# Patient Record
Sex: Male | Born: 2000 | Hispanic: No | Marital: Single | Smoking: Never smoker
Health system: Southern US, Community
[De-identification: ages and names within clinical notes are randomized; demographics above are authoritative.]

## PROBLEM LIST (undated history)

## (undated) DIAGNOSIS — E079 Disorder of thyroid, unspecified: Secondary | ICD-10-CM

---
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
## 2021-12-05 ENCOUNTER — Other Ambulatory Visit: Payer: Self-pay

## 2021-12-05 ENCOUNTER — Emergency Department (HOSPITAL_COMMUNITY)
Admission: EM | Admit: 2021-12-05 | Discharge: 2021-12-06 | Disposition: A | Discharge status: Home / Self Care | Payer: Medicaid Other | Attending: Emergency Medicine | Admitting: Emergency Medicine

## 2021-12-05 ENCOUNTER — Encounter (HOSPITAL_COMMUNITY): Payer: Self-pay

## 2021-12-05 DIAGNOSIS — W5911XA Bitten by nonvenomous snake, initial encounter: Secondary | ICD-10-CM

## 2021-12-05 DIAGNOSIS — T63001A Toxic effect of unspecified snake venom, accidental (unintentional), initial encounter: Secondary | ICD-10-CM | POA: Insufficient documentation

## 2021-12-05 DIAGNOSIS — Z23 Encounter for immunization: Secondary | ICD-10-CM | POA: Insufficient documentation

## 2021-12-05 LAB — CBC WITH DIFFERENTIAL/PLATELET
Abs Immature Granulocytes: 0.03 10*3/uL (ref 0.00–0.07)
Abs Immature Granulocytes: 0.02 10*3/uL (ref 0.00–0.07)
Basophils Absolute: 0 10*3/uL (ref 0.0–0.1)
Basophils Absolute: 0 10*3/uL (ref 0.0–0.1)
Basophils Relative: 0 %
Basophils Relative: 0 %
Eosinophils Absolute: 0 10*3/uL (ref 0.0–0.5)
Eosinophils Absolute: 0 10*3/uL (ref 0.0–0.5)
Eosinophils Relative: 0 %
Eosinophils Relative: 0 %
HCT: 45.6 % (ref 39.0–52.0)
HCT: 45.5 % (ref 39.0–52.0)
Hemoglobin: 16 g/dL (ref 13.0–17.0)
Hemoglobin: 16.2 g/dL (ref 13.0–17.0)
Immature Granulocytes: 0 %
Immature Granulocytes: 0 %
Lymphocytes Relative: 21 %
Lymphocytes Relative: 16 %
Lymphs Abs: 1.9 10*3/uL (ref 0.7–4.0)
Lymphs Abs: 1.4 10*3/uL (ref 0.7–4.0)
MCH: 32.3 pg (ref 26.0–34.0)
MCH: 32.6 pg (ref 26.0–34.0)
MCHC: 35.1 g/dL (ref 30.0–36.0)
MCHC: 35.6 g/dL (ref 30.0–36.0)
MCV: 92.1 fL (ref 80.0–100.0)
MCV: 91.5 fL (ref 80.0–100.0)
Monocytes Absolute: 0.6 10*3/uL (ref 0.1–1.0)
Monocytes Absolute: 0.5 10*3/uL (ref 0.1–1.0)
Monocytes Relative: 6 %
Monocytes Relative: 6 %
Neutro Abs: 6.6 10*3/uL (ref 1.7–7.7)
Neutro Abs: 6.8 10*3/uL (ref 1.7–7.7)
Neutrophils Relative %: 73 %
Neutrophils Relative %: 78 %
Platelets: 298 10*3/uL (ref 150–400)
Platelets: 296 10*3/uL (ref 150–400)
RBC: 4.95 MIL/uL (ref 4.22–5.81)
RBC: 4.97 MIL/uL (ref 4.22–5.81)
RDW: 12.5 % (ref 11.5–15.5)
RDW: 12.5 % (ref 11.5–15.5)
WBC: 9.1 10*3/uL (ref 4.0–10.5)
WBC: 8.9 10*3/uL (ref 4.0–10.5)
nRBC: 0 % (ref 0.0–0.2)
nRBC: 0 % (ref 0.0–0.2)

## 2021-12-05 LAB — PROTIME-INR
INR: 1.1 (ref 0.8–1.2)
INR: 1 (ref 0.8–1.2)
Prothrombin Time: 13.7 seconds (ref 11.4–15.2)
Prothrombin Time: 13.3 seconds (ref 11.4–15.2)

## 2021-12-05 LAB — BASIC METABOLIC PANEL
Anion gap: 9 (ref 5–15)
BUN: 10 mg/dL (ref 6–20)
CO2: 26 mmol/L (ref 22–32)
Calcium: 9.4 mg/dL (ref 8.9–10.3)
Chloride: 105 mmol/L (ref 98–111)
Creatinine, Ser: 1.17 mg/dL (ref 0.61–1.24)
GFR, Estimated: >60 mL/min (ref >60)
Glucose, Bld: 97 mg/dL (ref 70–99)
Potassium: 3.9 mmol/L (ref 3.5–5.1)
Sodium: 140 mmol/L (ref 135–145)

## 2021-12-05 LAB — FIBRINOGEN
Fibrinogen: 269 mg/dL (ref 210–475)
Fibrinogen: 280 mg/dL (ref 210–475)

## 2021-12-05 MED ORDER — TETANUS-DIPHTH-ACELL PERTUSSIS 5-2.5-18.5 LF-MCG/0.5 IM SUSY
0.5000 mL | PREFILLED_SYRINGE | Freq: Once | INTRAMUSCULAR | Status: AC
  Administered 2021-12-05: 0.5 mL via INTRAMUSCULAR
  Filled 2021-12-05: qty 0.5

## 2021-12-05 NOTE — ED Triage Notes (Signed)
Pt reports snake bite . Pt reports he thinks it was a copperhead. Pt denies any pain just localized pain

## 2021-12-05 NOTE — ED Provider Notes (Incomplete)
  [facility] EMERGENCY DEPARTMENT Provider Note   CSN: [id number] Arrival date & time: 12/05/21  2140     History {Add pertinent medical, surgical, social history, OB history to HPI:1} Chief Complaint  Patient presents with  . Snake Bite    Dylan Alvarez is a 21 y.o. male.  The history is provided by the patient.  Dylan Alvarez is a 21 y.o. male who presents to the Emergency Department complaining of snakebite.  He presents to the emergency department for evaluation of snakebite that happened just a few minutes prior to ED arrival.  He states that he was walking and a copperhead bit him in the right leg.  He had some mild tingling at the site, overall no symptoms currently.  He has no known medical problems and takes no medications    Home Medications Prior to Admission medications   Medication Sig Start Date End Date Taking? Authorizing Provider  albuterol (PROVENTIL HFA) 108 (90 Base) MCG/ACT inhaler INHALE 2 PUFFS INTO THE LUNGS EVERY 6 (SIX) HOURS AS NEEDED FOR WHEEZING. 04/28/16   Rumley, [place], DO      Allergies    Patient has no known allergies.    Review of Systems   Review of Systems  Physical Exam Updated Vital Signs BP 132/77 (BP Location: Right Arm)   Pulse 82   Temp (!) 97.5 F (36.4 C) (Oral)   Resp 14   SpO2 97%  Physical Exam  ED Results / Procedures / Treatments   Labs (all labs ordered are listed, but only abnormal results are displayed) Labs Reviewed  BASIC METABOLIC PANEL  CBC WITH DIFFERENTIAL/PLATELET  CBC WITH DIFFERENTIAL/PLATELET  PROTIME-INR  PROTIME-INR  FIBRINOGEN  FIBRINOGEN  CBC WITH DIFFERENTIAL/PLATELET  PROTIME-INR  FIBRINOGEN    EKG None  Radiology No results found.  Procedures Procedures  {Document cardiac monitor, telemetry assessment procedure when appropriate:1}  Medications Ordered in ED Medications  Tdap (BOOSTRIX) injection 0.5 mL (0.5 mLs Intramuscular Given 12/05/21 2212)    ED  Course/ Medical Decision Making/ A&P                           Medical Decision Making  ***  {Document critical care time when appropriate:1} {Document review of labs and clinical decision tools ie heart score, Chads2Vasc2 etc:1}  {Document your independent review of radiology images, and any outside records:1} {Document your discussion with family members, caretakers, and with consultants:1} {Document social determinants of health affecting pt's care:1} {Document your decision making why or why not admission, treatments were needed:1} Final Clinical Impression(s) / ED Diagnoses Final diagnoses:  None    Rx / DC Orders ED Discharge Orders     None

## 2021-12-05 NOTE — ED Provider Triage Note (Signed)
Emergency Medicine Provider Triage Evaluation Note  Dylan Alvarez , a 21 y.o. male  was evaluated in triage.  Pt complains of snake bite. Pt report he was walking outside talking on the phone and accidentally stepped on a 2-43ft long snake which bit him in his R ankle.  Thought it may be a copper head.  Report tinging sensation at the bite site without significant pain.  No fever, trouble breathing, cp, sob, abd cramping  Review of Systems  Positive: As above Negative: As above  Physical Exam  BP (!) 150/101 (BP Location: Right Arm)   Pulse (!) 108   Temp (!) 97.5 F (36.4 C) (Oral)   Resp (!) 24   SpO2 99%  Gen:   Awake, no distress   Resp:  Normal effort  MSK:   Moves extremities without difficulty  Other:  R ankle: no obvious bite mark appreciated.  Leg compartment soft, brisk cap refill, intact dorsalis pedis pulse  Medical Decision Making  Medically screening exam initiated at 9:55 PM.  Appropriate orders placed.  Dylan Alvarez was informed that the remainder of the evaluation will be completed by another provider, this initial triage assessment does not replace that evaluation, and the importance of remaining in the ED until their evaluation is complete.     Domenic Moras, PA-C 12/05/21 2201

## 2021-12-05 NOTE — ED Provider Notes (Signed)
[facility] EMERGENCY DEPARTMENT Provider Note   CSN: [id number] Arrival date & time: 12/05/21  2140     History  Chief Complaint  Patient presents with   Snake Bite    Dylan Alvarez is a 21 y.o. male.  The history is provided by the patient.  Dylan Alvarez is a 21 y.o. male who presents to the Emergency Department complaining of snakebite.  He presents to the emergency department for evaluation of snakebite that happened just a few minutes prior to ED arrival.  He states that he was walking and a copperhead bit him in the right leg.  He had some mild tingling at the site, overall no symptoms currently.  He has no known medical problems and takes no medications    Home Medications Prior to Admission medications   Medication Sig Start Date End Date Taking? Authorizing Provider  albuterol (PROVENTIL HFA) 108 (90 Base) MCG/ACT inhaler INHALE 2 PUFFS INTO THE LUNGS EVERY 6 (SIX) HOURS AS NEEDED FOR WHEEZING. 04/28/16   Rumley, [place], DO      Allergies    Patient has no known allergies.    Review of Systems   Review of Systems  All other systems reviewed and are negative.  Physical Exam Updated Vital Signs BP (!) 105/47   Pulse 75   Temp (!) 97.5 F (36.4 C) (Oral)   Resp 19   SpO2 96%  Physical Exam Vitals and nursing note reviewed.  Constitutional:      Appearance: He is well-developed.  HENT:     Head: Normocephalic and atraumatic.  Cardiovascular:     Rate and Rhythm: Normal rate and regular rhythm.     Heart sounds: No murmur heard. Pulmonary:     Effort: Pulmonary effort is normal. No respiratory distress.     Breath sounds: Normal breath sounds.  Abdominal:     Palpations: Abdomen is soft.     Tenderness: There is no abdominal tenderness. There is no guarding or rebound.  Musculoskeletal:        General: No tenderness.     Comments: There is a faint area of ecchymosis over the right distal medial calf without significant soft tissue  swelling or tenderness to palpation.  2+ DP pulses.  Skin:    General: Skin is warm and dry.  Neurological:     Mental Status: He is alert and oriented to person, place, and time.  Psychiatric:        Behavior: Behavior normal.    ED Results / Procedures / Treatments   Labs (all labs ordered are listed, but only abnormal results are displayed) Labs Reviewed  BASIC METABOLIC PANEL  CBC WITH DIFFERENTIAL/PLATELET  CBC WITH DIFFERENTIAL/PLATELET  CBC WITH DIFFERENTIAL/PLATELET  PROTIME-INR  PROTIME-INR  PROTIME-INR  FIBRINOGEN  FIBRINOGEN  FIBRINOGEN    EKG None  Radiology No results found.  Procedures Procedures    Medications Ordered in ED Medications  ibuprofen (ADVIL) tablet 400 mg (has no administration in time range)  Tdap (BOOSTRIX) injection 0.5 mL (0.5 mLs Intramuscular Given 12/05/21 2212)    ED Course/ Medical Decision Making/ A&P                           Medical Decision Making Risk Prescription drug management.   Patient here for evaluation following possible bite by a copperhead.  He has minimal local ecchymosis at time of ED evaluation without any edema or soft tissue  swelling.  Labs without evidence of acute coagulopathy.  He was observed for several hours in the emergency department without development of symptoms.  No progression of swelling.  Repeat labs are without acute abnormality.  Discussed with patient home care for snakebite, likely dry bite based on symptoms.  Plan to discharge with outpatient follow-up and return precautions.        Final Clinical Impression(s) / ED Diagnoses Final diagnoses:  Snake bite, initial encounter    Rx / DC Orders ED Discharge Orders     None         Tilden Fossa, MD 12/06/21 0505

## 2021-12-06 LAB — FIBRINOGEN: Fibrinogen: 252 mg/dL (ref 210–475)

## 2021-12-06 LAB — CBC WITH DIFFERENTIAL/PLATELET
Abs Immature Granulocytes: 0.03 10*3/uL (ref 0.00–0.07)
Basophils Absolute: 0 10*3/uL (ref 0.0–0.1)
Basophils Relative: 1 %
Eosinophils Absolute: 0.1 10*3/uL (ref 0.0–0.5)
Eosinophils Relative: 1 %
HCT: 41.3 % (ref 39.0–52.0)
Hemoglobin: 14.6 g/dL (ref 13.0–17.0)
Immature Granulocytes: 0 %
Lymphocytes Relative: 30 %
Lymphs Abs: 2.5 10*3/uL (ref 0.7–4.0)
MCH: 32.3 pg (ref 26.0–34.0)
MCHC: 35.4 g/dL (ref 30.0–36.0)
MCV: 91.4 fL (ref 80.0–100.0)
Monocytes Absolute: 0.7 10*3/uL (ref 0.1–1.0)
Monocytes Relative: 9 %
Neutro Abs: 5 10*3/uL (ref 1.7–7.7)
Neutrophils Relative %: 59 %
Platelets: 282 10*3/uL (ref 150–400)
RBC: 4.52 MIL/uL (ref 4.22–5.81)
RDW: 12.6 % (ref 11.5–15.5)
WBC: 8.3 10*3/uL (ref 4.0–10.5)
nRBC: 0 % (ref 0.0–0.2)

## 2021-12-06 LAB — PROTIME-INR
INR: 1.1 (ref 0.8–1.2)
Prothrombin Time: 14.1 seconds (ref 11.4–15.2)

## 2021-12-06 MED ORDER — IBUPROFEN 400 MG PO TABS
400.0000 mg | ORAL_TABLET | Freq: Once | ORAL | Status: AC
  Administered 2021-12-06: 400 mg via ORAL
  Filled 2021-12-06: qty 1

## 2021-12-06 NOTE — ED Notes (Signed)
Spoke with Danielle from poison control. Update given: labs, bite description. Recommendation: safe for discharge at this time.

## 2021-12-06 NOTE — ED Notes (Signed)
Left lower extremity measures 9.75in. right lower extremity measures 9.75in.

## 2021-12-06 NOTE — ED Notes (Signed)
Lt LE is 9.75in. Rt LE is 9.75in.

## 2021-12-06 NOTE — ED Notes (Signed)
Poison control recommends to obs pt minimum 8hr for LE snake bite, measure the area Q1hr for 3 hours. Repeat labs in 6 hours. No abx or nsaids recommended.

## 2021-12-06 NOTE — ED Notes (Signed)
Pt verbalized understanding of d/c instructions, meds, and followup care. Denies questions. VSS, no distress noted. Steady gait to exit with all belongings.  ?

## 2021-12-08 ENCOUNTER — Telehealth: Payer: Self-pay

## 2021-12-09 NOTE — Telephone Encounter (Signed)
Transition Care Management Unsuccessful Follow-up Telephone Call  Date of discharge and from where:  12/05/2021 from [facility]  Attempts:  1st Attempt  Reason for unsuccessful TCM follow-up call:  Left voice message    

## 2021-12-10 NOTE — Telephone Encounter (Signed)
Transition Care Management Unsuccessful Follow-up Telephone Call  Date of discharge and from where:  12/05/2021 from [facility] Attempts:  2nd Attempt  Reason for unsuccessful TCM follow-up call:  Left voice message

## 2021-12-11 NOTE — Telephone Encounter (Signed)
Transition Care Management Unsuccessful Follow-up Telephone Call  Date of discharge and from where:  12/05/2021 from [facility]  Attempts:  3rd Attempt  Reason for unsuccessful TCM follow-up call:  Unable to reach patient
